# Patient Record
Sex: Male | Born: 2000 | Race: Black or African American | Hispanic: No | Marital: Single | Smoking: Never smoker
Health system: Southern US, Community
[De-identification: ages and names within clinical notes are randomized; demographics above are authoritative.]

## PROBLEM LIST (undated history)

## (undated) DIAGNOSIS — J020 Streptococcal pharyngitis: Secondary | ICD-10-CM

---
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
## 2022-02-21 ENCOUNTER — Ambulatory Visit (HOSPITAL_COMMUNITY)
Admission: EM | Admit: 2022-02-21 | Discharge: 2022-02-21 | Disposition: A | Discharge status: Home / Self Care | Payer: BC Managed Care – PPO | Attending: Physician Assistant | Admitting: Physician Assistant

## 2022-02-21 ENCOUNTER — Encounter (HOSPITAL_COMMUNITY): Payer: Self-pay

## 2022-02-21 DIAGNOSIS — B085 Enteroviral vesicular pharyngitis: Secondary | ICD-10-CM | POA: Diagnosis not present

## 2022-02-21 DIAGNOSIS — J029 Acute pharyngitis, unspecified: Secondary | ICD-10-CM | POA: Diagnosis present

## 2022-02-21 LAB — POCT RAPID STREP A, ED / UC: Streptococcus, Group A Screen (Direct): NEGATIVE

## 2022-02-21 MED ORDER — NYSTATIN 100000 UNIT/ML MT SUSP: 5.0000 mL | Freq: Four times a day (QID) | OROMUCOSAL | 0 refills | Status: DC | PRN

## 2022-02-21 NOTE — ED Provider Notes (Signed)
[facility]    CSN: [id number] Arrival date & time: 02/21/22  1349      History   Chief Complaint Chief Complaint  Patient presents with   Sore Throat    HPI Donald Memoli is a 21 y.o. male.   Patient presents today with a 4-day history of left-sided sore throat.  Reports pain is rated 8 on a 0-10 pain scale, localized to left tonsillar area, described as sharp, worse with swallowing, no alleviating factors identified.  Denies any known sick contacts.  He has tried ibuprofen without improvement of symptoms.  He reports pain with swallowing but is eating and drinking normally.  Denies any fever, cough, congestion.  Denies any medication changes or exposure to chemotherapy.  Denies additional lesions.  Denies recent antibiotic or steroid use.    Past Medical History:  Diagnosis Date   Strep throat     Patient Active Problem List   Diagnosis Date Noted   Obesity, unspecified 12/15/2012    History reviewed. No pertinent surgical history.     Home Medications    Prior to Admission medications   Medication Sig Start Date End Date Taking? Authorizing Provider  magic mouthwash (nystatin, lidocaine, diphenhydrAMINE) suspension Take 5 mLs by mouth 4 (four) times daily as needed for mouth pain. Swish and spit 02/21/22  Yes Kayode Petion K, PA-C  acetaminophen (TYLENOL) 160 MG/5ML liquid Take 20 mLs (640 mg total) by mouth every 4 (four) hours as needed. 09/14/16   Sherrilee Gilles, NP    Family History Family History  Problem Relation Age of Onset   Autism Brother     Social History Social History   Tobacco Use   Smoking status: Never   Smokeless tobacco: Never  Vaping Use   Vaping Use: Some days  Substance Use Topics   Alcohol use: No   Drug use: No     Allergies   Patient has no known allergies.   Review of Systems Review of Systems  Constitutional:  Positive for activity change. Negative for appetite change, fatigue and fever.  HENT:   Positive for mouth sores, sore throat and trouble swallowing. Negative for congestion and voice change.   Respiratory:  Negative for cough and shortness of breath.   Cardiovascular:  Negative for chest pain.  Gastrointestinal:  Negative for abdominal pain, diarrhea, nausea and vomiting.  Neurological:  Negative for dizziness, light-headedness and headaches.     Physical Exam Triage Vital Signs ED Triage Vitals  Enc Vitals Group     BP 02/21/22 1427 113/75     Pulse Rate 02/21/22 1427 61     Resp 02/21/22 1427 18     Temp 02/21/22 1427 97.8 F (36.6 C)     Temp Source 02/21/22 1427 Oral     SpO2 02/21/22 1427 98 %     Weight --      Height --      Head Circumference --      Peak Flow --      Pain Score 02/21/22 1426 7     Pain Loc --      Pain Edu? --      Excl. in GC? --    No data found.  Updated Vital Signs BP 113/75   Pulse 61   Temp 97.8 F (36.6 C) (Oral)   Resp 18   SpO2 98%   Visual Acuity Right Eye Distance:   Left Eye Distance:   Bilateral Distance:    Right Eye  Near:   Left Eye Near:    Bilateral Near:     Physical Exam Vitals reviewed.  Constitutional:      General: He is awake.     Appearance: Normal appearance. He is well-developed. He is not ill-appearing.     Comments: Very pleasant male appears stated age in no acute distress sitting comfortably in exam room  HENT:     Head: Normocephalic and atraumatic.     Right Ear: Tympanic membrane, ear canal and external ear normal. Tympanic membrane is not erythematous or bulging.     Left Ear: Tympanic membrane, ear canal and external ear normal. Tympanic membrane is not erythematous or bulging.     Nose: Nose normal.     Mouth/Throat:     Mouth: Oral lesions present.     Pharynx: Uvula midline. No oropharyngeal exudate, posterior oropharyngeal erythema or uvula swelling.     Tonsils: No tonsillar exudate or tonsillar abscesses.     Comments: Ulcerated lesions noted left tonsillar  pillars Cardiovascular:     Rate and Rhythm: Normal rate and regular rhythm.     Heart sounds: Normal heart sounds, S1 normal and S2 normal. No murmur heard. Pulmonary:     Effort: Pulmonary effort is normal. No accessory muscle usage or respiratory distress.     Breath sounds: Normal breath sounds. No stridor. No wheezing, rhonchi or rales.     Comments: Clear to auscultation bilaterally Lymphadenopathy:     Head:     Right side of head: No submental, submandibular or tonsillar adenopathy.     Left side of head: No submental, submandibular or tonsillar adenopathy.     Cervical: No cervical adenopathy.  Neurological:     Mental Status: He is alert.  Psychiatric:        Behavior: Behavior is cooperative.      UC Treatments / Results  Labs (all labs ordered are listed, but only abnormal results are displayed) Labs Reviewed  CULTURE, GROUP A STREP [facility])  POCT RAPID STREP A, ED / UC    EKG   Radiology No results found.  Procedures Procedures (including critical care time)  Medications Ordered in UC Medications - No data to display  Initial Impression / Assessment and Plan / UC Course  I have reviewed the triage vital signs and the nursing notes.  Pertinent labs & imaging results that were available during my care of the patient were reviewed by me and considered in my medical decision making (see chart for details).     Strep testing was negative in clinic today.  Throat culture pending per protocol.  Discussed symptoms are more consistent with herpangina.  Recommended conservative treatment measures including gargling with warm salt water and alternate Tylenol and ibuprofen for analgesia.  Patient was prescribed Magic mouthwash for additional symptom relief with instruction to gargle/swish and spit this several times per day.  Discussed that if he has any worsening symptoms including spread of lesions, inability to swallow, swelling of his throat, shortness of breath,  fever, nausea, vomiting he needs to be seen immediately.  Strict return precautions given.  Work excuse note provided.  Final Clinical Impressions(s) / UC Diagnoses   Final diagnoses:  Herpangina  Sore throat     Discharge Instructions      You have ulcers on your tonsils which is consistent with something called herpangina are typically caused by a virus.  This will usually get better on its own.  Gargle with warm salt water and  use over-the-counter medications including Tylenol/ibuprofen for pain relief.  I have called in a mouthwash that you can use up to 4 times a day to help with pain and encourage healing.  You should swish/gargle with this and spit it out.  If anything worsens and this spread significantly or you get to a point where you cannot eat/drink or swallow you need to be seen immediately.    ED Prescriptions     Medication Sig Dispense Auth. Provider   magic mouthwash (nystatin, lidocaine, diphenhydrAMINE) suspension Take 5 mLs by mouth 4 (four) times daily as needed for mouth pain. Swish and spit 180 mL Zahriah Roes K, PA-C      PDMP not reviewed this encounter.   Jeani Hawking, PA-C 02/21/22 1507

## 2022-02-21 NOTE — ED Triage Notes (Signed)
Patient presents to Urgent Care with complaints of sore throat since last week. Patient reports 8/10 pain has been taking Advil for pain management .

## 2022-02-21 NOTE — Discharge Instructions (Signed)
You have ulcers on your tonsils which is consistent with something called herpangina are typically caused by a virus.  This will usually get better on its own.  Gargle with warm salt water and use over-the-counter medications including Tylenol/ibuprofen for pain relief.  I have called in a mouthwash that you can use up to 4 times a day to help with pain and encourage healing.  You should swish/gargle with this and spit it out.  If anything worsens and this spread significantly or you get to a point where you cannot eat/drink or swallow you need to be seen immediately.

## 2022-02-24 LAB — CULTURE, GROUP A STREP (THRC)

## 2022-07-04 ENCOUNTER — Ambulatory Visit (HOSPITAL_COMMUNITY)
Admission: EM | Admit: 2022-07-04 | Discharge: 2022-07-04 | Disposition: A | Discharge status: Home / Self Care | Payer: Medicaid Other | Attending: Physician Assistant | Admitting: Physician Assistant

## 2022-07-04 ENCOUNTER — Encounter (HOSPITAL_COMMUNITY): Payer: Self-pay | Admitting: Emergency Medicine

## 2022-07-04 DIAGNOSIS — J069 Acute upper respiratory infection, unspecified: Secondary | ICD-10-CM | POA: Diagnosis present

## 2022-07-04 DIAGNOSIS — Z1152 Encounter for screening for COVID-19: Secondary | ICD-10-CM | POA: Insufficient documentation

## 2022-07-04 LAB — RESP PANEL BY RT-PCR (FLU A&B, COVID) ARPGX2
Influenza A by PCR: NEGATIVE
Influenza B by PCR: NEGATIVE
SARS Coronavirus 2 by RT PCR: NEGATIVE

## 2022-07-04 NOTE — Discharge Instructions (Signed)
You have test pending.  Tylenol for fever

## 2022-07-04 NOTE — ED Provider Notes (Signed)
[facility]    CSN: [id number] Arrival date & time: 07/04/22  1127      History   Chief Complaint Chief Complaint  Patient presents with   chest congestion   Sore Throat   Nasal Congestion   Fatigue    HPI Brian Lester is a 21 y.o. male.   Pt complains of a cough and congestion.  Pt has pain in his abdomen when he coughs.   No language interpreter was used.  Sore Throat This is a new problem. The problem occurs constantly. The problem has not changed since onset.Nothing aggravates the symptoms. Nothing relieves the symptoms. He has tried nothing for the symptoms. The treatment provided no relief.    Past Medical History:  Diagnosis Date   Strep throat     Patient Active Problem List   Diagnosis Date Noted   Obesity, unspecified 12/15/2012    History reviewed. No pertinent surgical history.     Home Medications    Prior to Admission medications   Medication Sig Start Date End Date Taking? Authorizing Provider  acetaminophen (TYLENOL) 160 MG/5ML liquid Take 20 mLs (640 mg total) by mouth every 4 (four) hours as needed. 09/14/16   Sherrilee Gilles, NP  magic mouthwash (nystatin, lidocaine, diphenhydrAMINE) suspension Take 5 mLs by mouth 4 (four) times daily as needed for mouth pain. Swish and spit 02/21/22   Raspet, Noberto Retort, PA-C    Family History Family History  Problem Relation Age of Onset   Autism Brother     Social History Social History   Tobacco Use   Smoking status: Never   Smokeless tobacco: Never  Vaping Use   Vaping Use: Some days  Substance Use Topics   Alcohol use: No   Drug use: No     Allergies   Patient has no known allergies.   Review of Systems Review of Systems  All other systems reviewed and are negative.    Physical Exam Triage Vital Signs ED Triage Vitals  Enc Vitals Group     BP 07/04/22 1302 135/81     Pulse Rate 07/04/22 1302 65     Resp 07/04/22 1302 18     Temp 07/04/22 1302 98.1 F (36.7 C)      Temp Source 07/04/22 1302 Oral     SpO2 07/04/22 1302 97 %     Weight --      Height --      Head Circumference --      Peak Flow --      Pain Score 07/04/22 1301 6     Pain Loc --      Pain Edu? --      Excl. in GC? --    No data found.  Updated Vital Signs BP 135/81 (BP Location: Left Arm)   Pulse 65   Temp 98.1 F (36.7 C) (Oral)   Resp 18   SpO2 97%   Visual Acuity Right Eye Distance:   Left Eye Distance:   Bilateral Distance:    Right Eye Near:   Left Eye Near:    Bilateral Near:     Physical Exam Vitals and nursing note reviewed.  Constitutional:      General: He is not in acute distress.    Appearance: He is well-developed.  HENT:     Head: Normocephalic and atraumatic.     Mouth/Throat:     Mouth: Mucous membranes are moist.     Tonsils: Tonsillar abscess present. No tonsillar  exudate.  Eyes:     Conjunctiva/sclera: Conjunctivae normal.  Cardiovascular:     Rate and Rhythm: Normal rate and regular rhythm.     Heart sounds: No murmur heard. Pulmonary:     Effort: Pulmonary effort is normal. No respiratory distress.     Breath sounds: Normal breath sounds.  Abdominal:     Palpations: Abdomen is soft.     Tenderness: There is no abdominal tenderness.  Musculoskeletal:        General: No swelling.     Cervical back: Neck supple.  Skin:    General: Skin is warm and dry.     Capillary Refill: Capillary refill takes less than 2 seconds.  Neurological:     Mental Status: He is alert.  Psychiatric:        Mood and Affect: Mood normal.      UC Treatments / Results  Labs (all labs ordered are listed, but only abnormal results are displayed) Labs Reviewed  RESP PANEL BY RT-PCR (FLU A&B, COVID) [id number]    EKG   Radiology No results found.  Procedures Procedures (including critical care time)  Medications Ordered in UC Medications - No data to display  Initial Impression / Assessment and Plan / UC Course  I have reviewed the triage  vital signs and the nursing notes.  Pertinent labs & imaging results that were available during my care of the patient were reviewed by me and considered in my medical decision making (see chart for details).     MDM:  Pt has viral sounding illness  Pt avsided symptomatic treatment  Final Clinical Impressions(s) / UC Diagnoses   Final diagnoses:  Upper respiratory tract infection, unspecified type     Discharge Instructions      You have test pending.  Tylenol for fever    ED Prescriptions   None    PDMP not reviewed this encounter. An After Visit Summary was printed and given to the patient.    Fransico Meadow, [place] 07/04/22 1343

## 2022-07-04 NOTE — ED Triage Notes (Signed)
Pt reports chest and nasal congestion, dry scratchy throat, fatigue, productive cough x 1 week. States he feels tightness in abdominal cavity when trying to breathe. Has taken Dayquil, Nyquil and Claritin with some relief.

## 2022-08-26 ENCOUNTER — Ambulatory Visit (HOSPITAL_COMMUNITY)
Admission: EM | Admit: 2022-08-26 | Discharge: 2022-08-26 | Disposition: A | Discharge status: Home / Self Care | Payer: Medicaid Other | Attending: Emergency Medicine | Admitting: Emergency Medicine

## 2022-08-26 ENCOUNTER — Encounter (HOSPITAL_COMMUNITY): Payer: Self-pay

## 2022-08-26 DIAGNOSIS — Z1152 Encounter for screening for COVID-19: Secondary | ICD-10-CM | POA: Diagnosis not present

## 2022-08-26 DIAGNOSIS — B349 Viral infection, unspecified: Secondary | ICD-10-CM

## 2022-08-26 DIAGNOSIS — J111 Influenza due to unidentified influenza virus with other respiratory manifestations: Secondary | ICD-10-CM | POA: Insufficient documentation

## 2022-08-26 DIAGNOSIS — Z79899 Other long term (current) drug therapy: Secondary | ICD-10-CM | POA: Insufficient documentation

## 2022-08-26 MED ORDER — OSELTAMIVIR PHOSPHATE 75 MG PO CAPS: 75.0000 mg | ORAL_CAPSULE | Freq: Two times a day (BID) | ORAL | 0 refills | Status: DC

## 2022-08-26 NOTE — Discharge Instructions (Addendum)
We will call you if your test result is positive, you may view these test results on MyChart.   As discussed, you may ask the pharmacist for Sudafed that you can take 2 times daily for nasal congestion.  Tamiflu has been sent to the pharmacy, you may take this 2 times daily for the next 5 days, this is an antiviral medication for the flu, this medication may cause stomach upset and if it becomes difficult for you to take the medicine due to the stomach upset, please discontinue the medicine.  Viral illnesses usually takes 7 to 10 days to resolve.  Using over-the-counter medications can help treat the symptoms.  You may rotate Tylenol and ibuprofen every 4-6 hours.  For example, take Tylenol now and then 4 to 6 hours later take ibuprofen.  You can use Tylenol for fever and moderate pain, you can take this medication every 4-6 hours, please do not take more than 3000 mg in a 24-hour day.  You can take ibuprofen every 6 hours, do not take more than 2400 mg in a 24-hour day.  I advised that you do not take ibuprofen on an empty stomach, ibuprofen can cause GI problems such as GI bleeding.  Chloraseptic throat spray and lozenges can be used for sore throat.  You may use warm liquids and honey for symptom management.  Maintaining hydration status is very important, please drink at least 8 cups of water daily.  Please try to intake nutrient dense meals.

## 2022-08-26 NOTE — ED Provider Notes (Signed)
[facility]    CSN: [id number] Arrival date & time: 08/26/22  7425      History   Chief Complaint Chief Complaint  Patient presents with   Cough   Headache    HPI Brian Lester is a 21 y.o. male.  Patient presents due to generalized body aches, productive cough, nasal congestion, and loss of appetite that started 4 days ago.  Patient reports that he has had a temperature of 103 at its highest at home.  Patient reports that he had 2 episodes of emesis and 2 episodes of diarrhea upon onset of symptoms, he states that the diarrhea and emesis has now resolved.  He states that many of his colleagues have been sick with an illness of unknown etiology.  He states that he is taking TheraFlu with some relief of symptoms.  He denies any chronic health problems.   Cough Associated symptoms: chills, fever, headaches, myalgias, rhinorrhea and sore throat   Associated symptoms: no chest pain, no ear pain, no shortness of breath and no wheezing   Headache Associated symptoms: congestion, cough, diarrhea (Upon onset of symptoms, has now resolved), drainage, fatigue, fever, myalgias, nausea (Upon onset of symptoms, has now resolved), sore throat and vomiting (Upon onset of symptoms, has now resolved)   Associated symptoms: no abdominal pain, no ear pain and no sinus pressure     Past Medical History:  Diagnosis Date   Strep throat     Patient Active Problem List   Diagnosis Date Noted   Obesity, unspecified 12/15/2012    History reviewed. No pertinent surgical history.     Home Medications    Prior to Admission medications   Medication Sig Start Date End Date Taking? Authorizing Provider  oseltamivir (TAMIFLU) 75 MG capsule Take 1 capsule (75 mg total) by mouth every 12 (twelve) hours. 08/26/22  Yes Debby Freiberg, NP  acetaminophen (TYLENOL) 160 MG/5ML liquid Take 20 mLs (640 mg total) by mouth every 4 (four) hours as needed. 09/14/16   Sherrilee Gilles, NP  magic  mouthwash (nystatin, lidocaine, diphenhydrAMINE) suspension Take 5 mLs by mouth 4 (four) times daily as needed for mouth pain. Swish and spit 02/21/22   Raspet, Noberto Retort, PA-C    Family History Family History  Problem Relation Age of Onset   Autism Brother     Social History Social History   Tobacco Use   Smoking status: Never   Smokeless tobacco: Never  Vaping Use   Vaping Use: Some days  Substance Use Topics   Alcohol use: No   Drug use: No     Allergies   Patient has no known allergies.   Review of Systems Review of Systems  Constitutional:  Positive for activity change, appetite change, chills, fatigue and fever.  HENT:  Positive for congestion, postnasal drip, rhinorrhea and sore throat. Negative for ear discharge, ear pain, sinus pressure, sinus pain and trouble swallowing.   Eyes: Negative.   Respiratory:  Positive for cough. Negative for chest tightness, shortness of breath and wheezing.   Cardiovascular:  Negative for chest pain and palpitations.  Gastrointestinal:  Positive for diarrhea (Upon onset of symptoms, has now resolved), nausea (Upon onset of symptoms, has now resolved) and vomiting (Upon onset of symptoms, has now resolved). Negative for abdominal pain.  Musculoskeletal:  Positive for myalgias.  Neurological:  Positive for headaches.     Physical Exam Triage Vital Signs ED Triage Vitals  Enc Vitals Group     BP 08/26/22  1159 134/81     Pulse Rate 08/26/22 1159 100     Resp 08/26/22 1159 16     Temp 08/26/22 1159 100 F (37.8 C)     Temp Source 08/26/22 1159 Oral     SpO2 08/26/22 1159 97 %     Weight --      Height --      Head Circumference --      Peak Flow --      Pain Score 08/26/22 1158 8     Pain Loc --      Pain Edu? --      Excl. in GC? --    No data found.  Updated Vital Signs BP 134/81 (BP Location: Right Arm)   Pulse 100   Temp 100 F (37.8 C) (Oral)   Resp 16   SpO2 97%     Physical Exam Vitals and nursing note  reviewed.  Constitutional:      Appearance: He is ill-appearing.  HENT:     Right Ear: Hearing, tympanic membrane, ear canal and external ear normal.     Left Ear: Hearing, tympanic membrane, ear canal and external ear normal.     Nose: Congestion and rhinorrhea present. Rhinorrhea is clear.     Right Turbinates: Not enlarged, swollen or pale.     Left Turbinates: Not enlarged, swollen or pale.     Right Sinus: No maxillary sinus tenderness or frontal sinus tenderness.     Left Sinus: No maxillary sinus tenderness or frontal sinus tenderness.     Mouth/Throat:     Mouth: Mucous membranes are moist.     Pharynx: Oropharynx is clear. Uvula midline. No pharyngeal swelling, oropharyngeal exudate, posterior oropharyngeal erythema or uvula swelling.     Tonsils: No tonsillar exudate or tonsillar abscesses. 0 on the right. 0 on the left.  Cardiovascular:     Rate and Rhythm: Normal rate and regular rhythm.     Heart sounds: Normal heart sounds, S1 normal and S2 normal.  Pulmonary:     Effort: Pulmonary effort is normal.     Breath sounds: Normal breath sounds and air entry. No decreased breath sounds, wheezing, rhonchi or rales.  Lymphadenopathy:     Cervical: No cervical adenopathy.  Neurological:     Mental Status: He is alert.      UC Treatments / Results  Labs (all labs ordered are listed, but only abnormal results are displayed) Labs Reviewed  SARS CORONAVIRUS 2 (TAT 6-24 HRS)    EKG   Radiology No results found.  Procedures Procedures (including critical care time)  Medications Ordered in UC Medications - No data to display  Initial Impression / Assessment and Plan / UC Course  I have reviewed the triage vital signs and the nursing notes.  Pertinent labs & imaging results that were available during my care of the patient were reviewed by me and considered in my medical decision making (see chart for details).     Patient is being treated for viral illness.   Based on symptomology, patient presentation, and physical assessment patient is being empirically treated for Influenza. Due to national shortage of supplies for PCR testing (currently unavailable) and high false negative rate of POC testing, this writer will treat empirically.  COVID test performed and is pending upon patient request.  Tamiflu antiviral treatment was sent to the pharmacy.  Patient was made aware of symptom management of a viral illness.  He was made aware to maintain adequate  hydration. Patient made aware of timeline for symptom resolution and when follow-up would be necessary.  A work note was given.  Patient made aware of results reporting protocol and MyChart.  Patient verbalized understanding of instructions.    Charting was provided using a a verbal dictation system, charting was proofread for errors, errors may occur which could change the meaning of the information charted.   Final Clinical Impressions(s) / UC Diagnoses   Final diagnoses:  Viral illness  Influenza-like illness     Discharge Instructions      We will call you if your test result is positive, you may view these test results on MyChart.   As discussed, you may ask the pharmacist for Sudafed that you can take 2 times daily for nasal congestion.    ED Prescriptions     Medication Sig Dispense Auth. Provider   oseltamivir (TAMIFLU) 75 MG capsule Take 1 capsule (75 mg total) by mouth every 12 (twelve) hours. 10 capsule Debby Freiberg, NP      PDMP not reviewed this encounter.   Debby Freiberg, NP 08/26/22 1353

## 2022-08-26 NOTE — ED Triage Notes (Signed)
Pt is here for cough, chills, fatigue, body aches sore throat , nasal congestion, runny nose , back pain , loss of appetite , vomiting , diarrhea x 4days

## 2022-08-27 LAB — SARS CORONAVIRUS 2 (TAT 6-24 HRS): SARS Coronavirus 2: NEGATIVE

## 2023-02-16 DIAGNOSIS — Z113 Encounter for screening for infections with a predominantly sexual mode of transmission: Secondary | ICD-10-CM | POA: Diagnosis not present

## 2023-04-12 NOTE — Progress Notes (Signed)
This encounter was created in error - please disregard.

## 2023-08-05 ENCOUNTER — Emergency Department (HOSPITAL_COMMUNITY)
Admission: EM | Admit: 2023-08-05 | Discharge: 2023-08-05 | Disposition: A | Discharge status: Home / Self Care | Payer: BC Managed Care – PPO | Attending: Emergency Medicine | Admitting: Emergency Medicine

## 2023-08-05 ENCOUNTER — Emergency Department (HOSPITAL_COMMUNITY): Payer: BC Managed Care – PPO

## 2023-08-05 ENCOUNTER — Encounter (HOSPITAL_COMMUNITY): Payer: Self-pay

## 2023-08-05 ENCOUNTER — Other Ambulatory Visit: Payer: Self-pay

## 2023-08-05 DIAGNOSIS — M5127 Other intervertebral disc displacement, lumbosacral region: Secondary | ICD-10-CM | POA: Diagnosis not present

## 2023-08-05 DIAGNOSIS — M51369 Other intervertebral disc degeneration, lumbar region without mention of lumbar back pain or lower extremity pain: Secondary | ICD-10-CM | POA: Diagnosis not present

## 2023-08-05 DIAGNOSIS — Y92410 Unspecified street and highway as the place of occurrence of the external cause: Secondary | ICD-10-CM | POA: Insufficient documentation

## 2023-08-05 DIAGNOSIS — S3992XA Unspecified injury of lower back, initial encounter: Secondary | ICD-10-CM | POA: Diagnosis not present

## 2023-08-05 DIAGNOSIS — M5126 Other intervertebral disc displacement, lumbar region: Secondary | ICD-10-CM | POA: Diagnosis not present

## 2023-08-05 DIAGNOSIS — M4807 Spinal stenosis, lumbosacral region: Secondary | ICD-10-CM | POA: Diagnosis not present

## 2023-08-05 DIAGNOSIS — M549 Dorsalgia, unspecified: Secondary | ICD-10-CM | POA: Diagnosis not present

## 2023-08-05 DIAGNOSIS — M545 Low back pain, unspecified: Secondary | ICD-10-CM | POA: Diagnosis not present

## 2023-08-05 MED ORDER — ACETAMINOPHEN 325 MG PO TABS
650.0000 mg | ORAL_TABLET | Freq: Once | ORAL | Status: AC | PRN
  Administered 2023-08-05: 650 mg via ORAL
  Filled 2023-08-05: qty 2

## 2023-08-05 MED ORDER — IBUPROFEN 800 MG PO TABS
800.0000 mg | ORAL_TABLET | Freq: Once | ORAL | Status: AC
  Administered 2023-08-05: 800 mg via ORAL
  Filled 2023-08-05: qty 1

## 2023-08-05 MED ORDER — IBUPROFEN 800 MG PO TABS: 800.0000 mg | ORAL_TABLET | Freq: Three times a day (TID) | ORAL | 0 refills | Status: DC

## 2023-08-05 MED ORDER — OXYCODONE-ACETAMINOPHEN 5-325 MG PO TABS
1.0000 | ORAL_TABLET | ORAL | Status: DC | PRN
  Administered 2023-08-05: 1 via ORAL
  Filled 2023-08-05: qty 1

## 2023-08-05 MED ORDER — METHOCARBAMOL 750 MG PO TABS: 750.0000 mg | ORAL_TABLET | Freq: Three times a day (TID) | ORAL | 0 refills | Status: DC | PRN
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
## 2023-08-09 ENCOUNTER — Ambulatory Visit (INDEPENDENT_AMBULATORY_CARE_PROVIDER_SITE_OTHER): Payer: BC Managed Care – PPO | Admitting: Family Medicine

## 2023-08-09 VITALS — BP 141/86 | HR 86 | Ht 67.0 in | Wt 150.2 lb

## 2023-08-09 DIAGNOSIS — M545 Low back pain, unspecified: Secondary | ICD-10-CM

## 2023-08-09 DIAGNOSIS — Z7689 Persons encountering health services in other specified circumstances: Secondary | ICD-10-CM

## 2023-08-09 DIAGNOSIS — Z23 Encounter for immunization: Secondary | ICD-10-CM

## 2023-08-09 MED ORDER — IBUPROFEN 800 MG PO TABS: 800.0000 mg | ORAL_TABLET | Freq: Three times a day (TID) | ORAL | 0 refills | Status: DC

## 2023-08-09 NOTE — Patient Instructions (Signed)
 MyChart:  For all urgent or time sensitive needs we ask that you please call the office to avoid delays. Our number is [phone]. MyChart is not constantly monitored and due to the large volume of messages a day, replies may take up to 72 business hours.   MyChart Policy: MyChart allows for you to see your visit notes, after visit summary, provider recommendations, lab and tests results, make an appointment, request refills, and contact your provider or the office for non-urgent questions or concerns. Providers are seeing patients during normal business hours and do not have built in time to review MyChart messages.  We ask that you allow a minimum of 3 business days for responses to [employer]. For this reason, please do not send urgent requests through MyChart. Please call the office at [phone]. New and ongoing conditions may require a visit. We have virtual and in person visit available for your convenience.  Complex MyChart concerns may require a visit. Your provider may request you schedule a virtual or in person visit to ensure we are providing the best care possible. MyChart messages sent after 11:00 AM on Friday will not be received by the provider until Monday morning.    Lab and Test Results: You will receive your lab and test results on MyChart as soon as they are completed and results have been sent by the lab or testing facility. Due to this service, you will receive your results BEFORE your provider.  I review lab and tests results each morning prior to seeing patients. Some results require collaboration with other providers to ensure you are receiving the most appropriate care. For this reason, we ask that you please allow a minimum of 3-5 business days from the time the ALL results have been received for your provider to receive and review lab and test results and contact you about these.  Most lab and test result comments from the provider will be sent through MyChart.  Your provider may recommend changes to the plan of care, follow-up visits, repeat testing, ask questions, or request an office visit to discuss these results. You may reply directly to this message or call the office at [phone] to provide information for the provider or set up an appointment. In some instances, you will be called with test results and recommendations. Please let us know if this is preferred and we will make note of this in your chart to provide this for you.    If you have not heard a response to your lab or test results in 5 business days from all results returning to MyChart, please call the office to let us know. We ask that you please avoid calling prior to this time unless there is an emergent concern. Due to high call volumes, this can delay the resulting process.   After Hours: For all non-emergency after hours needs, please call the office at [phone] and select the option to reach the on-call provider service. On-call services are shared between multiple [place] offices and therefore it will not be possible to speak directly with your provider. On-call providers may provide medical advice and recommendations, but are unable to provide refills for maintenance medications.  For all emergency or urgent medical needs after normal business hours, we recommend that you seek care at the closest Urgent Care or Emergency Department to ensure appropriate treatment in a timely manner.  [facility] has a 24 hour emergency room located on the ground floor for your  convenience.    Urgent Concerns During the Business Day Providers are seeing patients from 8AM to 5PM, Monday through Thursday, and 8AM to 12PM on Friday with a busy schedule and are most often not able to respond to non-urgent calls until the end of the day or the next business day. If you should have URGENT concerns during the day, please call and speak to the nurse or schedule a same day  appointment so that we can address your concern without delay.    Thank you, again, for choosing me as your health care partner. I appreciate your trust and look forward to learning more about you.    Brian Reedy, FNP-C
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
## 2023-08-10 ENCOUNTER — Encounter (HOSPITAL_BASED_OUTPATIENT_CLINIC_OR_DEPARTMENT_OTHER): Payer: Self-pay | Admitting: Student

## 2023-08-10 ENCOUNTER — Ambulatory Visit (HOSPITAL_BASED_OUTPATIENT_CLINIC_OR_DEPARTMENT_OTHER): Payer: BC Managed Care – PPO | Admitting: Student

## 2023-08-10 DIAGNOSIS — M5442 Lumbago with sciatica, left side: Secondary | ICD-10-CM | POA: Diagnosis not present

## 2023-08-10 DIAGNOSIS — M5441 Lumbago with sciatica, right side: Secondary | ICD-10-CM
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
## 2023-08-17 ENCOUNTER — Encounter (HOSPITAL_BASED_OUTPATIENT_CLINIC_OR_DEPARTMENT_OTHER): Payer: Self-pay

## 2023-08-17 DIAGNOSIS — M5441 Lumbago with sciatica, right side: Secondary | ICD-10-CM

## 2023-08-21 ENCOUNTER — Ambulatory Visit (HOSPITAL_BASED_OUTPATIENT_CLINIC_OR_DEPARTMENT_OTHER): Payer: BC Managed Care – PPO

## 2023-08-21 DIAGNOSIS — H109 Unspecified conjunctivitis: Secondary | ICD-10-CM | POA: Diagnosis not present

## 2023-08-23 ENCOUNTER — Ambulatory Visit (HOSPITAL_BASED_OUTPATIENT_CLINIC_OR_DEPARTMENT_OTHER): Payer: Self-pay | Admitting: Family Medicine

## 2023-08-23 ENCOUNTER — Other Ambulatory Visit (HOSPITAL_BASED_OUTPATIENT_CLINIC_OR_DEPARTMENT_OTHER): Payer: Self-pay

## 2023-08-23 ENCOUNTER — Ambulatory Visit (INDEPENDENT_AMBULATORY_CARE_PROVIDER_SITE_OTHER): Payer: BC Managed Care – PPO | Admitting: Family Medicine

## 2023-08-23 VITALS — BP 94/70 | HR 78 | Wt 151.5 lb

## 2023-08-23 DIAGNOSIS — H109 Unspecified conjunctivitis: Secondary | ICD-10-CM | POA: Insufficient documentation

## 2023-08-23 DIAGNOSIS — M5441 Lumbago with sciatica, right side: Secondary | ICD-10-CM

## 2023-08-23 MED ORDER — OFLOXACIN 0.3 % OP SOLN
2.0000 [drp] | Freq: Four times a day (QID) | OPHTHALMIC | 0 refills | Status: DC
  Filled 2023-08-23: qty 5, 12d supply, fill #0

## 2023-08-23 NOTE — Progress Notes (Signed)
   Acute Office Visit  Subjective:     Patient ID: Brian Lester, male    DOB: 24-Jul-2001, 22 y.o.   MRN: [id number]  Chief Complaint  Patient presents with   Conjunctivitis    Started bothering him Sunday, was given eye drops states they make his eyes burn eye is draining   Back Pain    Pain is moving up his back, moving his shoulders makes his back pain worse. States back brace is uncomfortable   Brian Lester is a 22 year old male patient who presents today for an acute visit. He has complaints about his L eye that continues to be painful with burning sensation, despite seeking care at [facility] on 08/21/2023. He reports that symptoms started on Saturday and he woke up with eye drainage on Sunday morning. Reports that the [place] eye drops make the itching go away but reports his eye is still burning and red. He woke up this morning with yellow drainage causing crusting on his L eye.   Review of Systems  Constitutional:  Negative for chills, fever and malaise/fatigue.  HENT:  Negative for congestion and ear pain.   Eyes:  Positive for pain (L), discharge (L) and redness (L).  Respiratory:  Negative for cough and shortness of breath.   Cardiovascular:  Negative for chest pain and palpitations.       Objective:    BP 94/70   Pulse 78   Wt 151 lb 8 oz (68.7 kg)   SpO2 100%   BMI 23.73 kg/m   Physical Exam Vitals reviewed.  Constitutional:      Appearance: Normal appearance.  Eyes:     General:        Left eye: Discharge present.    Extraocular Movements: Extraocular movements intact.     Conjunctiva/sclera:     Right eye: Right conjunctiva is not injected. No chemosis, exudate or hemorrhage.    Left eye: Left conjunctiva is injected. Exudate present. No chemosis or hemorrhage. Neurological:     Mental Status: He is alert.    Assessment & Plan:   1. Bacterial conjunctivitis of left eye (Primary) Patient is a pleasant 22 year old male patient who presents today for concerns  about L eye redness, pain and burning/itching sensation. He has been using antibiotic prescribed by urgent care but reports he has had minimal relief and his eye is hurting more. Patient has left eye closed for a majority of the visit due to pain and irritation. Right eye: Right conjunctiva is not injected with no chemosis, exudate or hemorrhage. Left eye: Left conjunctiva is injected. Exudate present. No chemosis or hemorrhage. Pupils: Pupils are equal, round, and reactive to light. Patient reports both eyes are sensitive to light. Will change antibiotic eye drops to ofloxacin. Advised patient to use warm washcloth to provide relief, wash hands before touching his eyes, and to use lubricating eye drops PRN.  - ofloxacin (OCUFLOX) 0.3 % ophthalmic solution; Place 2 drops into the left eye 4 (four) times daily for 5 days.  Dispense: 5 mL; Refill: 0   Return if symptoms worsen or fail to improve.  Alyson Reedy, FNP
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
## 2023-08-23 NOTE — Patient Instructions (Addendum)
[facility] [phone]  Medicines Take or apply your antibiotic medicine as told by your doctor. Do not stop using it even if you start to feel better. Do not touch your eyelid with the eye-drop bottle or the ointment tube. Use artifical tears as needed for eye dryness.   Managing discomfort Wipe any fluid from your eye with a warm, wet washcloth or a cotton ball. Place a clean, warm, wet cloth on your eye. Do this for 10-20 minutes, 3-4 times a day.  General instructions Do not wear contacts until the infection is gone. Wear glasses until your doctor says it is okay to wear contacts again. Change or wash your pillowcase every day. Do not share towels or washcloths. Wash your hands often with soap and water for at least 20 seconds and especially before touching your face or eyes. Use paper towels to dry your hands. Do not touch or rub your eyes. Do not drive or use heavy machinery if your vision is blurred.

## 2023-08-24 ENCOUNTER — Other Ambulatory Visit: Payer: Self-pay

## 2023-08-24 ENCOUNTER — Telehealth: Payer: Self-pay | Admitting: Student

## 2023-08-24 ENCOUNTER — Encounter (HOSPITAL_BASED_OUTPATIENT_CLINIC_OR_DEPARTMENT_OTHER): Payer: Self-pay | Admitting: Physical Therapy

## 2023-08-24 ENCOUNTER — Ambulatory Visit (HOSPITAL_BASED_OUTPATIENT_CLINIC_OR_DEPARTMENT_OTHER): Payer: BC Managed Care – PPO | Attending: Student | Admitting: Physical Therapy

## 2023-08-24 DIAGNOSIS — M5459 Other low back pain: Secondary | ICD-10-CM | POA: Insufficient documentation

## 2023-08-24 DIAGNOSIS — M5441 Lumbago with sciatica, right side: Secondary | ICD-10-CM | POA: Insufficient documentation

## 2023-08-24 DIAGNOSIS — M62830 Muscle spasm of back: Secondary | ICD-10-CM | POA: Diagnosis not present

## 2023-08-24 DIAGNOSIS — M5442 Lumbago with sciatica, left side: Secondary | ICD-10-CM | POA: Diagnosis not present

## 2023-08-24 NOTE — Telephone Encounter (Signed)
Form received. Patient is requesting oow from 11/29-02/10/25. Please advise. Current work note states oow until 12/23. Thank you!
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
## 2023-08-25 ENCOUNTER — Telehealth (HOSPITAL_BASED_OUTPATIENT_CLINIC_OR_DEPARTMENT_OTHER): Payer: Self-pay | Admitting: Student

## 2023-08-25 NOTE — Telephone Encounter (Signed)
Contacted Tammy at [address] she has already completed the [employer] paper work and faxed them this morning. Patient is aware if he has questions I gave him her to for contact

## 2023-08-25 NOTE — Telephone Encounter (Signed)
Form completed with an estimate rtw of 09/15/22 per Hazle Nordmann.
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
## 2023-08-29 ENCOUNTER — Telehealth (HOSPITAL_BASED_OUTPATIENT_CLINIC_OR_DEPARTMENT_OTHER): Payer: Self-pay | Admitting: *Deleted

## 2023-08-29 ENCOUNTER — Other Ambulatory Visit (HOSPITAL_BASED_OUTPATIENT_CLINIC_OR_DEPARTMENT_OTHER): Payer: Self-pay | Admitting: Family Medicine

## 2023-08-29 ENCOUNTER — Encounter (HOSPITAL_BASED_OUTPATIENT_CLINIC_OR_DEPARTMENT_OTHER): Payer: Self-pay | Admitting: Physical Therapy

## 2023-08-29 ENCOUNTER — Ambulatory Visit (HOSPITAL_BASED_OUTPATIENT_CLINIC_OR_DEPARTMENT_OTHER): Payer: BC Managed Care – PPO | Admitting: Physical Therapy

## 2023-08-29 ENCOUNTER — Other Ambulatory Visit (HOSPITAL_BASED_OUTPATIENT_CLINIC_OR_DEPARTMENT_OTHER): Payer: Self-pay

## 2023-08-29 ENCOUNTER — Ambulatory Visit (INDEPENDENT_AMBULATORY_CARE_PROVIDER_SITE_OTHER): Payer: BC Managed Care – PPO | Admitting: Family Medicine

## 2023-08-29 ENCOUNTER — Encounter (HOSPITAL_BASED_OUTPATIENT_CLINIC_OR_DEPARTMENT_OTHER): Payer: Self-pay | Admitting: Family Medicine

## 2023-08-29 VITALS — BP 136/72 | HR 76 | Ht 67.0 in | Wt 153.6 lb

## 2023-08-29 DIAGNOSIS — M5442 Lumbago with sciatica, left side: Secondary | ICD-10-CM | POA: Diagnosis not present

## 2023-08-29 DIAGNOSIS — H109 Unspecified conjunctivitis: Secondary | ICD-10-CM

## 2023-08-29 DIAGNOSIS — H66002 Acute suppurative otitis media without spontaneous rupture of ear drum, left ear: Secondary | ICD-10-CM

## 2023-08-29 DIAGNOSIS — M62830 Muscle spasm of back: Secondary | ICD-10-CM

## 2023-08-29 DIAGNOSIS — M5441 Lumbago with sciatica, right side: Secondary | ICD-10-CM | POA: Diagnosis not present

## 2023-08-29 DIAGNOSIS — M5459 Other low back pain: Secondary | ICD-10-CM

## 2023-08-29 MED ORDER — AMOXICILLIN-POT CLAVULANATE 875-125 MG PO TABS
1.0000 | ORAL_TABLET | Freq: Two times a day (BID) | ORAL | 0 refills | Status: AC
  Filled 2023-08-29: qty 14, 7d supply, fill #0

## 2023-08-29 MED ORDER — ERYTHROMYCIN 5 MG/GM OP OINT
1.0000 | TOPICAL_OINTMENT | Freq: Four times a day (QID) | OPHTHALMIC | 0 refills | Status: AC
  Filled 2023-08-29: qty 3.5, 1d supply, fill #0

## 2023-08-29 NOTE — Telephone Encounter (Signed)
Copied from CRM [id number]. Topic: Clinical - Medication Refill >> Aug 29, 2023 12:11 PM Elle L wrote: Most Recent Primary Care Visit:  Provider: Alyson Reedy  Department: [facility]  Visit Type: ACUTE  Date: 08/23/2023  Medication: ***  Has the patient contacted their pharmacy?  (Agent: If no, request that the patient contact the pharmacy for the refill. If patient does not wish to contact the pharmacy document the reason why and proceed with request.) (Agent: If yes, when and what did the pharmacy advise?)  Is this the correct pharmacy for this prescription?  If no, delete pharmacy and type the correct one.  This is the patient's preferred pharmacy:  [id number] [place], [place] - [address] AT [facility] OF [facility] [address] & [facility] [address] [place] [address] Phone: [phone] Fax: [phone]  [facility]  - [facility] [address] [place] [address] Phone: [phone] Fax: [phone]   Has the prescription been filled recently?   Is the patient out of the medication?   Has the patient been seen for an appointment in the last year OR does the patient have an upcoming appointment?   Can we respond through MyChart?   Agent: Please be advised that Rx refills may take up to 3 business days. We ask that you follow-up with your pharmacy.

## 2023-08-29 NOTE — Progress Notes (Signed)
Acute Care Office Visit  Subjective:   Brian Lester 2001/01/13 08/29/2023  Chief Complaint  Patient presents with   eye infection    Patient was seen 12/17 due to eye infection. States he lost the drops that was prescribed and is requesting a new abx eye solution sent in to continue to help with the infection.    HPI: Patient was seen on 08/21/23  at [facility] and started on Polytrim drops for bacterial conjunctivitis and seen on 08/23/23 for bacterial conjunctivitis and given ofloxacin eye drops by PCP.  Pt reports he had been using one of the two eye drop solutions (unsure which one) and states he was having improvement in his symptoms. He left his bottle open and reports it was knocked over and empty when he woke up the next day. He states there is no medication left.   EYE COMPLAINT: Location: left  Onset: 08/21/23  Contact lens use: no Recent eye surgery: no Glaucoma: no  Discharge: yes, yellow and clear drainage from left eye Pain: no Photophobia: no Decreased Vision: no URI symptoms: yes, ear pain, sinus pressure, headache Itching/Allergy sxs: no  Red Flags: Trauma: no Foreign Body: no Vomiting/HA: no Halos around lights: no Chickenpox or shingles: no     The following portions of the patient's history were reviewed and updated as appropriate: past medical history, past surgical history, family history, social history, allergies, medications, and problem list.   Patient Active Problem List   Diagnosis Date Noted   Bacterial conjunctivitis of left eye 08/23/2023   Obesity, unspecified 12/15/2012   Past Medical History:  Diagnosis Date   Strep throat    History reviewed. No pertinent surgical history. Family History  Problem Relation Age of Onset   Autism Brother    Drug abuse Brother    Learning disabilities Brother    Outpatient Medications Prior to Visit  Medication Sig Dispense Refill   acetaminophen (TYLENOL) 500 MG tablet Take 500 mg by  mouth every 6 (six) hours as needed for mild pain (pain score 1-3).     ibuprofen (ADVIL) 800 MG tablet Take 1 tablet (800 mg total) by mouth 3 (three) times daily. 21 tablet 0   methocarbamol (ROBAXIN-750) 750 MG tablet Take 1 tablet (750 mg total) by mouth every 8 (eight) hours as needed for muscle spasms. 30 tablet 0   ofloxacin (OCUFLOX) 0.3 % ophthalmic solution Place 2 drops into the left eye 4 (four) times daily for 5 days. 5 mL 0   No facility-administered medications prior to visit.   No Known Allergies   ROS: A complete ROS was performed with pertinent positives/negatives noted in the HPI. The remainder of the ROS are negative.    Objective:   Today's Vitals   08/29/23 1329  BP: 136/72  Pulse: 76  SpO2: 98%  Weight: 153 lb 9.6 oz (69.7 kg)  Height: 5\' 7"  (1.702 m)    GENERAL: Well-appearing, in NAD. Well nourished.  SKIN: Pink, warm and dry. No rash, lesion, ulceration, or ecchymoses.  Head: Normocephalic. NECK: Trachea midline. Full ROM w/o pain or tenderness. No lymphadenopathy.  EARS: Right Tympanic membrane intact, slightly injected but translucent without bulging and without drainage. Appropriate landmarks visualized. Left TM is injected, bulging and red but intact. No drainage. Landmarks unable to be visualize due to edema.  EYES: Right eye is unremarkable. Left Conjunctiva injected with yellow and clear exudate and crusting to both eye lids. Left sclera is injected. EOMI, PERRL. No  visual deficit. Mild edema to left upper eye lid.  NOSE: Septum midline w/o deformity. Nares patent, mucosa pink and mildly inflamed w/ clear drainage. Mild sinus tenderness.  THROAT: Uvula midline. Oropharynx clear.  Mucous membranes pink and moist.  RESPIRATORY: Chest wall symmetrical. Respirations even and non-labored.  EXTREMITIES: Without clubbing, cyanosis, or edema.  NEUROLOGIC: No motor or sensory deficits. Steady, even gait. C2-C12 intact.  PSYCH/MENTAL STATUS: Alert, oriented x  3. Cooperative, appropriate mood and affect.    No results found for any visits on 08/29/23.    Assessment & Plan:   1. Bacterial conjunctivitis of left eye Start erythromycin ointment QID x 7 days. Patient is not a contact wearer. Follow up with Ophthalmology if no improvement within 48 hours. Referral placed. Continue warm compresses to left eye as needed for pain.   - erythromycin ophthalmic ointment; Place 1 Application into the left eye 4 (four) times daily for 7 days.  Dispense: 3.5 g; Refill: 0 - Ambulatory referral to Ophthalmology  2. Non-recurrent acute suppurative otitis media of left ear without spontaneous rupture of tympanic membrane (Primary) Start Augmentin BID x 7 days for AOM of left ear and possible right ear as well. Recommend follow up with PCP if no improvement in 48 hours.  - amoxicillin-clavulanate (AUGMENTIN) 875-125 MG tablet; Take 1 tablet by mouth 2 (two) times daily for 7 days.  Dispense: 14 tablet; Refill: 0  Return if symptoms worsen or fail to improve.    Patient to reach out to office if new, worrisome, or unresolved symptoms arise or if no improvement in patient's condition. Patient verbalized understanding and is agreeable to treatment plan. All questions answered to patient's satisfaction.    Hilbert Bible, [place]

## 2023-08-29 NOTE — Telephone Encounter (Signed)
Copied from CRM [id number]. Topic: Clinical - Medical Advice >> Aug 29, 2023 12:08 PM Elle L wrote: Reason for CRM: The patient lost his ofloxacin (OCUFLOX) 0.3 % ophthalmic solution pink eye prescription and states that it was getting better but is getting worse again. When I advised him of the up to 3 business days turnaround time for prescription refills he requested a call back from FNP Alyson Reedy as he is concerned that he still has pink eye. His call back number is [phone].   Spoke with pt who was in the lobby letting him know that Jon Gills wanted to see him to see if abx needed to be changed for his eye and told him we would see him today. Pt verbalized understanding.

## 2023-09-08 ENCOUNTER — Telehealth (HOSPITAL_BASED_OUTPATIENT_CLINIC_OR_DEPARTMENT_OTHER): Payer: Self-pay | Admitting: Student

## 2023-09-08 ENCOUNTER — Encounter (HOSPITAL_BASED_OUTPATIENT_CLINIC_OR_DEPARTMENT_OTHER): Payer: Self-pay | Admitting: Family Medicine

## 2023-09-08 ENCOUNTER — Ambulatory Visit (INDEPENDENT_AMBULATORY_CARE_PROVIDER_SITE_OTHER): Payer: BC Managed Care – PPO | Admitting: Family Medicine

## 2023-09-08 ENCOUNTER — Other Ambulatory Visit (HOSPITAL_BASED_OUTPATIENT_CLINIC_OR_DEPARTMENT_OTHER): Payer: Self-pay

## 2023-09-08 VITALS — BP 137/86 | HR 85 | Ht 67.0 in | Wt 156.7 lb

## 2023-09-08 DIAGNOSIS — Z0000 Encounter for general adult medical examination without abnormal findings: Secondary | ICD-10-CM

## 2023-09-08 DIAGNOSIS — M549 Dorsalgia, unspecified: Secondary | ICD-10-CM | POA: Insufficient documentation

## 2023-09-08 DIAGNOSIS — B309 Viral conjunctivitis, unspecified: Secondary | ICD-10-CM | POA: Diagnosis not present

## 2023-09-08 DIAGNOSIS — M545 Low back pain, unspecified: Secondary | ICD-10-CM

## 2023-09-08 MED ORDER — IBUPROFEN 800 MG PO TABS
800.0000 mg | ORAL_TABLET | Freq: Three times a day (TID) | ORAL | 0 refills | Status: DC
  Filled 2023-09-08: qty 30, 10d supply, fill #0

## 2023-09-08 NOTE — Telephone Encounter (Signed)
 Patient has papers for him to be out of work unitl Jan 9,2024 he would like you to extend it unitl Jan 13, until he sees you again. Patient wants to know if you could be this in his mychart or call patient at [phone]
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
## 2023-09-08 NOTE — Patient Instructions (Signed)
  Medication Instructions:  Your physician recommends that you continue on your current medications as directed. Please refer to the Current Medication list given to you today. --If you need a refill on any your medications before your next appointment, please call your pharmacy first. If no refills are authorized on file call the office.-- Lab Work: Your physician has recommended that you have lab work today: 1 week before your next visit If you have labs (blood work) drawn today and your tests are completely normal, you will receive your results via MyChart message OR a phone call from our staff.  Please ensure you check your voicemail in the event that you authorized detailed messages to be left on a delegated number. If you have any lab test that is abnormal or we need to change your treatment, we will call you to review the results.    Follow-Up: Your next appointment:   Your physician recommends that you schedule a follow-up appointment in: 3-4 months for physical with Dr. de Cuba  You will receive a text message or e-mail with a link to a survey about your care and experience with us  today! We would greatly appreciate your feedback!   Thanks for letting us  be apart of your health journey!!  Primary Care and Sports Medicine   Dr. Quintin sheerer [place]   We encourage you to activate your patient portal called MyChart.  Sign up information is provided on this After Visit Summary.  MyChart is used to connect with patients for Virtual Visits (Telemedicine).  Patients are able to view lab/test results, encounter notes, upcoming appointments, etc.  Non-urgent messages can be sent to your provider as well. To learn more about what you can do with MyChart, please visit --  [email or web address].

## 2023-09-15 ENCOUNTER — Encounter (HOSPITAL_BASED_OUTPATIENT_CLINIC_OR_DEPARTMENT_OTHER): Payer: Self-pay | Admitting: Physical Therapy

## 2023-09-15 ENCOUNTER — Ambulatory Visit (HOSPITAL_BASED_OUTPATIENT_CLINIC_OR_DEPARTMENT_OTHER): Payer: BC Managed Care – PPO | Attending: Student | Admitting: Physical Therapy

## 2023-09-15 DIAGNOSIS — M62830 Muscle spasm of back: Secondary | ICD-10-CM | POA: Diagnosis not present

## 2023-09-15 DIAGNOSIS — M5459 Other low back pain: Secondary | ICD-10-CM | POA: Insufficient documentation
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
## 2023-09-15 NOTE — Patient Instructions (Signed)

## 2023-09-16 ENCOUNTER — Ambulatory Visit (HOSPITAL_COMMUNITY)
Admission: RE | Admit: 2023-09-16 | Discharge: 2023-09-16 | Disposition: A | Discharge status: Home / Self Care | Payer: BC Managed Care – PPO | Source: Ambulatory Visit | Attending: Emergency Medicine | Admitting: Emergency Medicine

## 2023-09-16 ENCOUNTER — Encounter (HOSPITAL_COMMUNITY): Payer: Self-pay

## 2023-09-16 VITALS — BP 137/76 | HR 74 | Temp 98.2°F | Resp 16

## 2023-09-16 DIAGNOSIS — Z202 Contact with and (suspected) exposure to infections with a predominantly sexual mode of transmission: Secondary | ICD-10-CM

## 2023-09-16 DIAGNOSIS — Z113 Encounter for screening for infections with a predominantly sexual mode of transmission: Secondary | ICD-10-CM | POA: Diagnosis not present

## 2023-09-16 LAB — HIV ANTIBODY (ROUTINE TESTING W REFLEX): HIV Screen 4th Generation wRfx: NONREACTIVE

## 2023-09-16 MED ORDER — DOXYCYCLINE HYCLATE 100 MG PO CAPS: 100.0000 mg | ORAL_CAPSULE | Freq: Two times a day (BID) | ORAL | 0 refills | Status: AC

## 2023-09-16 NOTE — ED Triage Notes (Signed)
 Pt states his partner tested positive for chlamydia and is being treated starting today. He would like to be tested. He denies any sx.   He would like cyto and blood work today.

## 2023-09-16 NOTE — ED Provider Notes (Signed)
 [facility]    CSN: [id number] Arrival date & time: 09/16/23  1318      History   Chief Complaint Chief Complaint  Patient presents with   SEXUALLY TRANSMITTED DISEASE    HPI Brian Lester is a 23 y.o. male.  Presenting for STD testing Reports partner tested positive for chlamydia They have had unprotected intercourse He is not currently having any symptoms Would like blood work today too  Past Medical History:  Diagnosis Date   Strep throat     Patient Active Problem List   Diagnosis Date Noted   Back pain 09/08/2023   Bacterial conjunctivitis of left eye 08/23/2023   Obesity, unspecified 12/15/2012    History reviewed. No pertinent surgical history.     Home Medications    Prior to Admission medications   Medication Sig Start Date End Date Taking? Authorizing Provider  doxycycline  (VIBRAMYCIN ) 100 MG capsule Take 1 capsule (100 mg total) by mouth 2 (two) times daily for 7 days. 09/16/23 09/23/23 Yes Wilhelm Ganaway, [place], PA-C    Family History Family History  Problem Relation Age of Onset   Autism Brother    Drug abuse Brother    Learning disabilities Brother     Social History Social History   Tobacco Use   Smoking status: Never    Passive exposure: Never   Smokeless tobacco: Never  Vaping Use   Vaping status: Some Days  Substance Use Topics   Alcohol use: No   Drug use: No     Allergies   Patient has no known allergies.   Review of Systems Review of Systems Per HPI  Physical Exam Triage Vital Signs ED Triage Vitals [09/16/23 1332]  Encounter Vitals Group     BP 137/76     Systolic BP Percentile      Diastolic BP Percentile      Pulse Rate 74     Resp 16     Temp 98.2 F (36.8 C)     Temp Source Oral     SpO2 97 %     Weight      Height      Head Circumference      Peak Flow      Pain Score 0     Pain Loc      Pain Education      Exclude from Growth Chart    No data found.  Updated Vital Signs BP 137/76 (BP  Location: Left Arm)   Pulse 74   Temp 98.2 F (36.8 C) (Oral)   Resp 16   SpO2 97%    Physical Exam Vitals and nursing note reviewed.  Constitutional:      General: He is not in acute distress.    Appearance: Normal appearance.  Cardiovascular:     Rate and Rhythm: Normal rate and regular rhythm.     Heart sounds: Normal heart sounds.  Pulmonary:     Effort: Pulmonary effort is normal.     Breath sounds: Normal breath sounds.  Neurological:     Mental Status: He is alert and oriented to person, place, and time.     UC Treatments / Results  Labs (all labs ordered are listed, but only abnormal results are displayed) Labs Reviewed  RPR  HIV ANTIBODY (ROUTINE TESTING W REFLEX)  CYTOLOGY, (ORAL, ANAL, URETHRAL) ANCILLARY ONLY    EKG  Radiology No results found.  Procedures Procedures  Medications Ordered in UC Medications - No data to display  Initial  Impression / Assessment and Plan / UC Course  I have reviewed the triage vital signs and the nursing notes.  Pertinent labs & imaging results that were available during my care of the patient were reviewed by me and considered in my medical decision making (see chart for details).  Cytology swab pending with HIV/RPR With chlamydia exposure will treat with doxy BID x 7 days Safe sex practices discussed Patient without questions at this time  Final Clinical Impressions(s) / UC Diagnoses   Final diagnoses:  Screen for STD (sexually transmitted disease)  Exposure to chlamydia     Discharge Instructions      We will call you if anything on your swab or blood work returns positive. You can also see these results on MyChart. Please abstain from sexual intercourse until your results return.  I am treating you for chlamydia; take the doxycycline  2 times daily for 7 days. Take with food to avoid upset stomach.     ED Prescriptions     Medication Sig Dispense Auth. Provider   doxycycline  (VIBRAMYCIN ) 100 MG  capsule Take 1 capsule (100 mg total) by mouth 2 (two) times daily for 7 days. 14 capsule Lamichael Youkhana, [place]berry, PA-C      PDMP not reviewed this encounter.   Jeryl Asberry, [place] 09/16/23 [id number]

## 2023-09-16 NOTE — Discharge Instructions (Addendum)
 We will call you if anything on your swab or blood work returns positive. You can also see these results on MyChart. Please abstain from sexual intercourse until your results return.  I am treating you for chlamydia; take the doxycycline  2 times daily for 7 days. Take with food to avoid upset stomach.

## 2023-09-17 LAB — RPR: RPR Ser Ql: NONREACTIVE

## 2023-09-19 ENCOUNTER — Encounter (HOSPITAL_BASED_OUTPATIENT_CLINIC_OR_DEPARTMENT_OTHER): Payer: Self-pay | Admitting: Student

## 2023-09-19 ENCOUNTER — Other Ambulatory Visit (HOSPITAL_BASED_OUTPATIENT_CLINIC_OR_DEPARTMENT_OTHER): Payer: Self-pay

## 2023-09-19 ENCOUNTER — Ambulatory Visit (HOSPITAL_BASED_OUTPATIENT_CLINIC_OR_DEPARTMENT_OTHER): Payer: BC Managed Care – PPO | Admitting: Student

## 2023-09-19 DIAGNOSIS — M5442 Lumbago with sciatica, left side: Secondary | ICD-10-CM | POA: Diagnosis not present

## 2023-09-19 DIAGNOSIS — M5441 Lumbago with sciatica, right side: Secondary | ICD-10-CM

## 2023-09-19 LAB — CYTOLOGY, (ORAL, ANAL, URETHRAL) ANCILLARY ONLY
Chlamydia: NEGATIVE
Comment: NEGATIVE
Comment: NEGATIVE
Comment: NORMAL
Neisseria Gonorrhea: NEGATIVE
Trichomonas: NEGATIVE
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
## 2023-09-21 ENCOUNTER — Encounter: Payer: Self-pay | Admitting: Family Medicine

## 2023-09-22 ENCOUNTER — Other Ambulatory Visit: Payer: Self-pay | Admitting: Family Medicine

## 2023-09-22 ENCOUNTER — Ambulatory Visit (HOSPITAL_BASED_OUTPATIENT_CLINIC_OR_DEPARTMENT_OTHER): Payer: BC Managed Care – PPO | Admitting: Physical Therapy

## 2023-09-22 DIAGNOSIS — M545 Low back pain, unspecified: Secondary | ICD-10-CM

## 2023-09-22 MED ORDER — MELOXICAM 7.5 MG PO TABS: 7.5000 mg | ORAL_TABLET | Freq: Every day | ORAL | 0 refills | Status: AC

## 2023-09-29 ENCOUNTER — Encounter (HOSPITAL_BASED_OUTPATIENT_CLINIC_OR_DEPARTMENT_OTHER): Payer: Self-pay | Admitting: Student

## 2023-09-29 ENCOUNTER — Ambulatory Visit (HOSPITAL_BASED_OUTPATIENT_CLINIC_OR_DEPARTMENT_OTHER): Payer: BC Managed Care – PPO | Admitting: Physical Therapy

## 2023-10-03 ENCOUNTER — Ambulatory Visit
Admission: RE | Admit: 2023-10-03 | Discharge: 2023-10-03 | Disposition: A | Discharge status: Home / Self Care | Payer: BC Managed Care – PPO | Source: Ambulatory Visit | Attending: Student | Admitting: Student

## 2023-10-03 DIAGNOSIS — M5416 Radiculopathy, lumbar region: Secondary | ICD-10-CM | POA: Diagnosis not present

## 2023-10-03 DIAGNOSIS — M5441 Lumbago with sciatica, right side: Secondary | ICD-10-CM

## 2023-10-05 ENCOUNTER — Ambulatory Visit (HOSPITAL_BASED_OUTPATIENT_CLINIC_OR_DEPARTMENT_OTHER): Payer: BC Managed Care – PPO | Admitting: Physical Therapy

## 2023-10-05 ENCOUNTER — Encounter (HOSPITAL_BASED_OUTPATIENT_CLINIC_OR_DEPARTMENT_OTHER): Payer: Self-pay | Admitting: Physical Therapy

## 2023-10-05 DIAGNOSIS — M5459 Other low back pain: Secondary | ICD-10-CM

## 2023-10-05 DIAGNOSIS — M62830 Muscle spasm of back: Secondary | ICD-10-CM | POA: Diagnosis not present
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
## 2023-10-11 ENCOUNTER — Encounter (HOSPITAL_BASED_OUTPATIENT_CLINIC_OR_DEPARTMENT_OTHER): Payer: Self-pay

## 2023-10-12 ENCOUNTER — Ambulatory Visit (HOSPITAL_BASED_OUTPATIENT_CLINIC_OR_DEPARTMENT_OTHER): Payer: BC Managed Care – PPO | Admitting: Student

## 2023-10-12 ENCOUNTER — Encounter (HOSPITAL_BASED_OUTPATIENT_CLINIC_OR_DEPARTMENT_OTHER): Payer: Self-pay | Admitting: Student

## 2023-10-12 DIAGNOSIS — M5441 Lumbago with sciatica, right side: Secondary | ICD-10-CM | POA: Diagnosis not present

## 2023-10-12 DIAGNOSIS — M5442 Lumbago with sciatica, left side: Secondary | ICD-10-CM
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
## 2023-10-13 ENCOUNTER — Ambulatory Visit (HOSPITAL_BASED_OUTPATIENT_CLINIC_OR_DEPARTMENT_OTHER): Payer: BC Managed Care – PPO | Attending: Student | Admitting: Physical Therapy

## 2023-10-13 DIAGNOSIS — M5459 Other low back pain: Secondary | ICD-10-CM | POA: Insufficient documentation

## 2023-10-13 DIAGNOSIS — M62830 Muscle spasm of back: Secondary | ICD-10-CM | POA: Insufficient documentation

## 2023-11-30 ENCOUNTER — Other Ambulatory Visit (HOSPITAL_BASED_OUTPATIENT_CLINIC_OR_DEPARTMENT_OTHER): Payer: BC Managed Care – PPO

## 2023-12-07 ENCOUNTER — Encounter (HOSPITAL_BASED_OUTPATIENT_CLINIC_OR_DEPARTMENT_OTHER): Payer: BC Managed Care – PPO | Admitting: Family Medicine

## 2024-01-24 ENCOUNTER — Encounter (HOSPITAL_BASED_OUTPATIENT_CLINIC_OR_DEPARTMENT_OTHER): Admitting: Family Medicine

## 2024-04-11 ENCOUNTER — Encounter (HOSPITAL_BASED_OUTPATIENT_CLINIC_OR_DEPARTMENT_OTHER): Admitting: Family Medicine

## 2024-07-09 ENCOUNTER — Encounter: Payer: Self-pay | Admitting: Radiology
